# Patient Record
Sex: Female | Born: 1970 | Race: White | Hispanic: No | Marital: Married | Smoking: Never smoker
Health system: Southern US, Community
[De-identification: ages and names within clinical notes are randomized; demographics above are authoritative.]

---

## 2003-08-28 ENCOUNTER — Other Ambulatory Visit: Admission: RE | Admit: 2003-08-28 | Discharge: 2003-08-28 | Payer: Self-pay | Admitting: Family Medicine

## 2003-11-10 ENCOUNTER — Ambulatory Visit (HOSPITAL_COMMUNITY): Admission: RE | Admit: 2003-11-10 | Discharge: 2003-11-10 | Payer: Self-pay | Admitting: Obstetrics and Gynecology

## 2004-02-17 ENCOUNTER — Ambulatory Visit (HOSPITAL_COMMUNITY): Admission: RE | Admit: 2004-02-17 | Discharge: 2004-02-17 | Payer: Self-pay | Admitting: Obstetrics and Gynecology

## 2004-06-29 ENCOUNTER — Encounter (INDEPENDENT_AMBULATORY_CARE_PROVIDER_SITE_OTHER): Payer: Self-pay | Admitting: *Deleted

## 2004-06-29 ENCOUNTER — Inpatient Hospital Stay (HOSPITAL_COMMUNITY): Admission: AD | Admit: 2004-06-29 | Discharge: 2004-07-01 | Payer: Self-pay | Admitting: Obstetrics and Gynecology

## 2005-09-22 IMAGING — US US OB COMP +14 WK
1 series · 13 of 28 positions shown · non-contrast
Comparison: none

OBSTETRICAL ULTRASOUND
 Number of Fetuses:  1
 Heart Rate:  145
 Movement:  Yes
 Breathing:  No  
 Presentation:  Cephalic
 Placental Location:  Anterior
 Grade:  I
 Previa:  No
 Amniotic Fluid (Subjective):  Normal
 Amniotic Fluid (Objective):   3.9 cm Vertical pocket 

 FETAL BIOMETRY
 BPD:   4.9 cm   21 w 0 d
 HC:   18.8 cm  21 w 1 d
 AC:   14.8 cm  20 w 0 d
 FL:   3.1 cm  19 w 5 d
 MEAN GA:  20 w 3 d
 1st US GA:  19 w 6 d (Assigned)
   FETAL ANATOMY
 Lateral Ventricles:    Visualized 
 Thalami/CSP:      Visualized 
 Posterior Fossa:  Visualized 
 Nuchal Region:    Visualized 
 Spine:      Visualized 
 4 Chamber Heart on Left:      Not visualized 
 Stomach on Left:      Visualized 
 3 Vessel Cord:    Visualized 
 Cord Insertion site:    Visualized 
 Kidneys:  Visualized 
 Bladder:  Visualized 
 Extremities:      Visualized 
 ADDITIONAL ANATOMY VISUALIZED:  Upper lip, orbits, diaphragm, heel, 5th digit, ductal arch, aortic arch and male genitalia
 Evaluation limited by:  Fetal position 
 MATERNAL FINDINGS
 Cervix: 4.5 cm Transabdominally

[Series 1: unknown · 0.15mm/px · 13 of 87 slices shown]
[im 4/87]
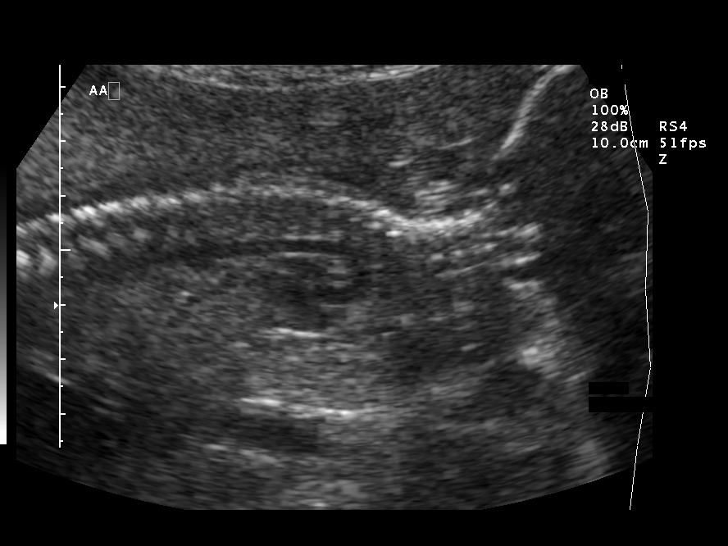
[im 10/87]
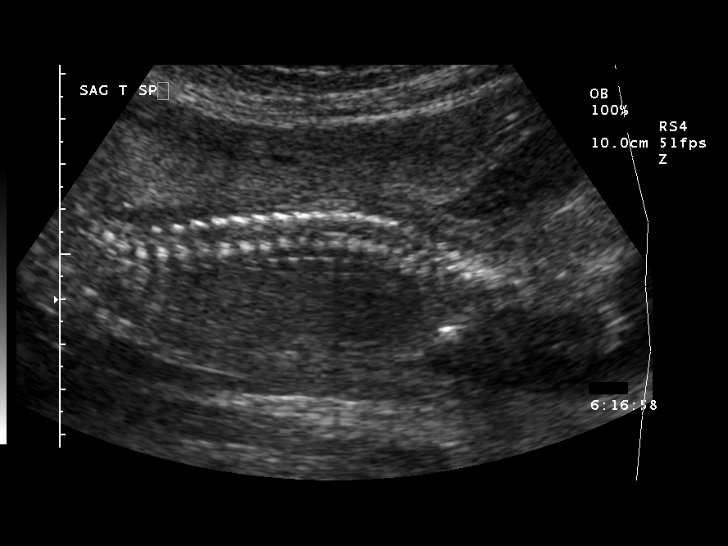
[im 16/87]
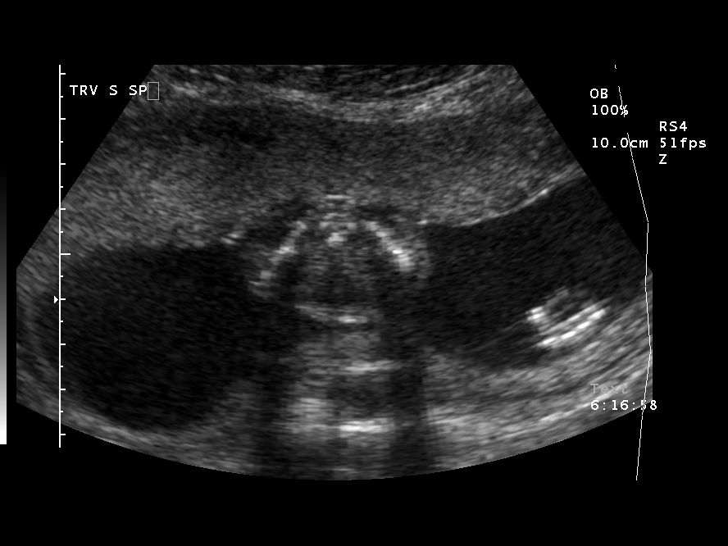
[im 23/87]
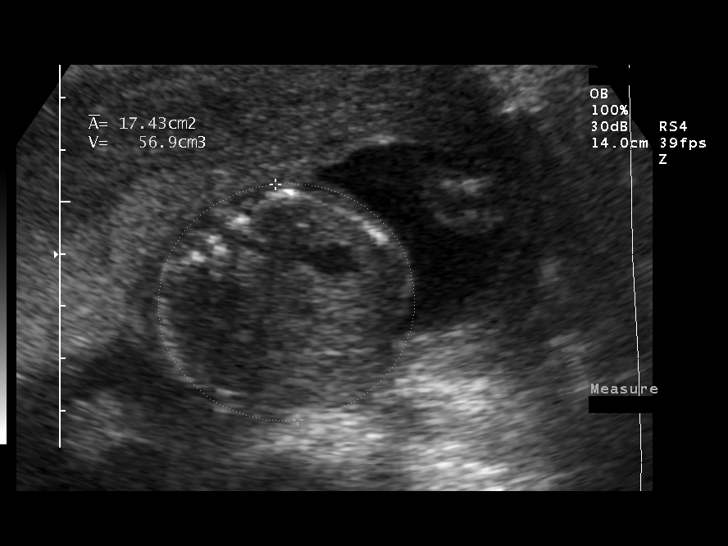
[im 29/87]
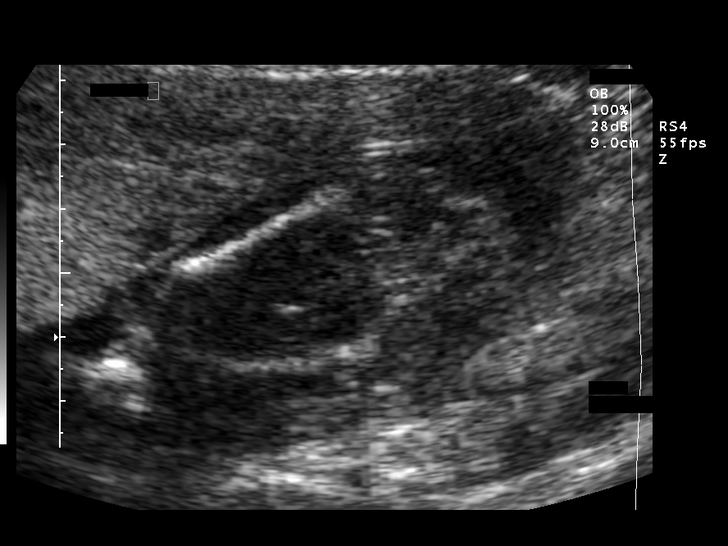
[im 36/87]
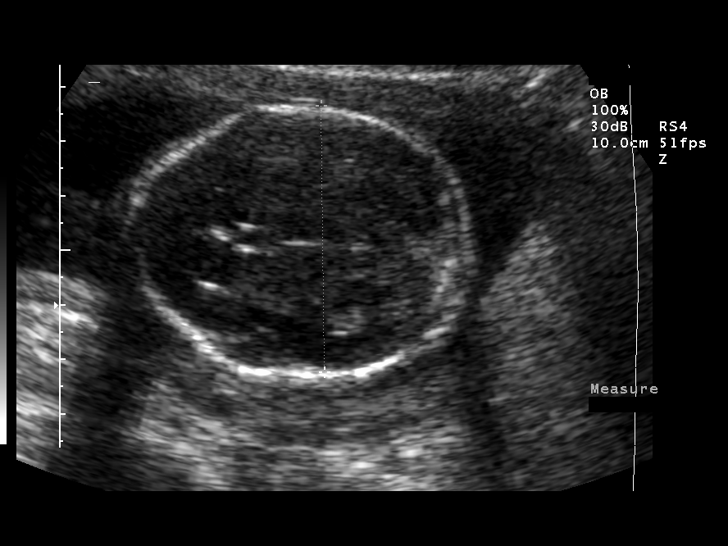
[im 45/87]
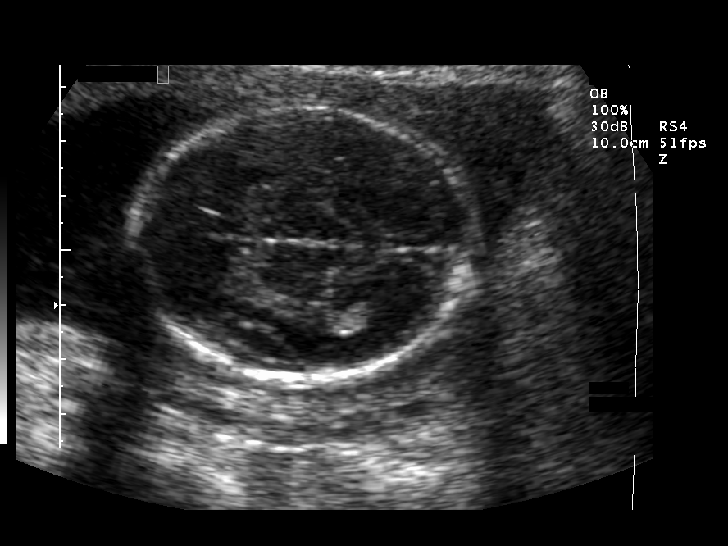
[im 51/87]
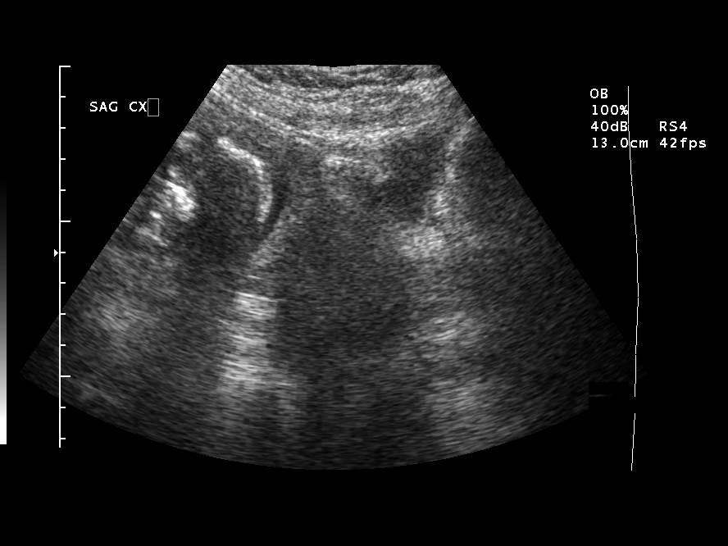
[im 58/87]
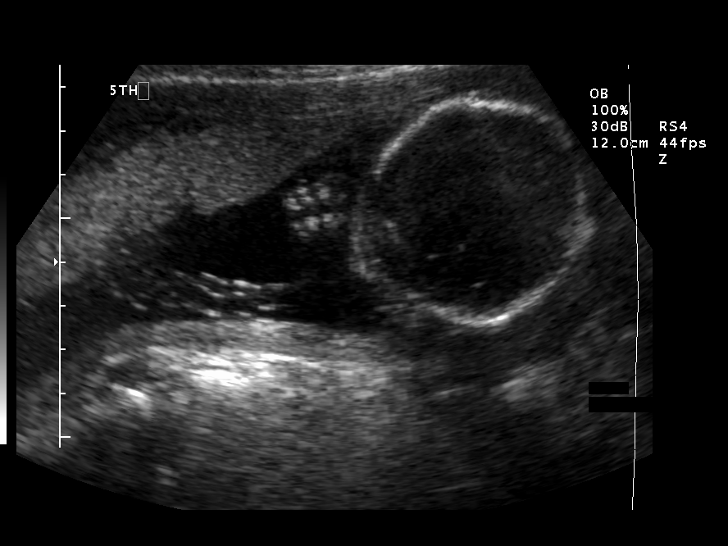
[im 64/87]
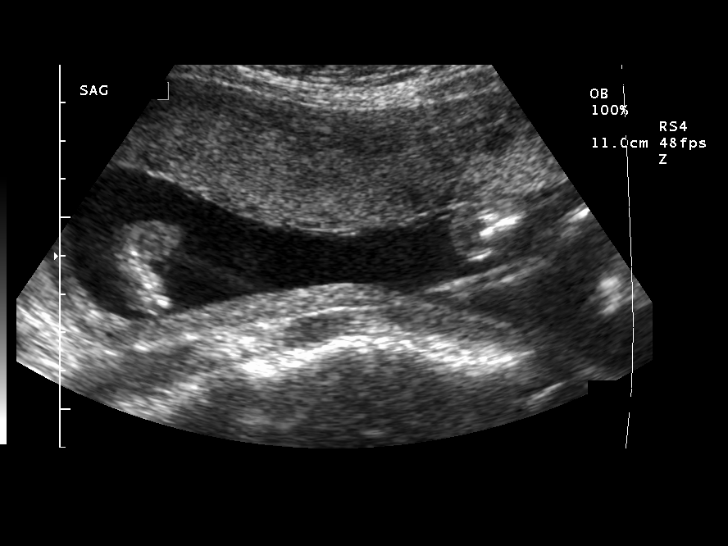
[im 71/87]
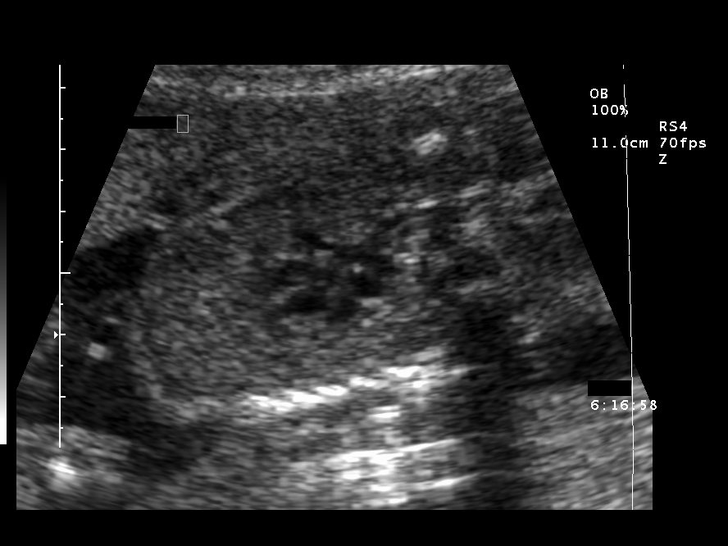
[im 77/87]
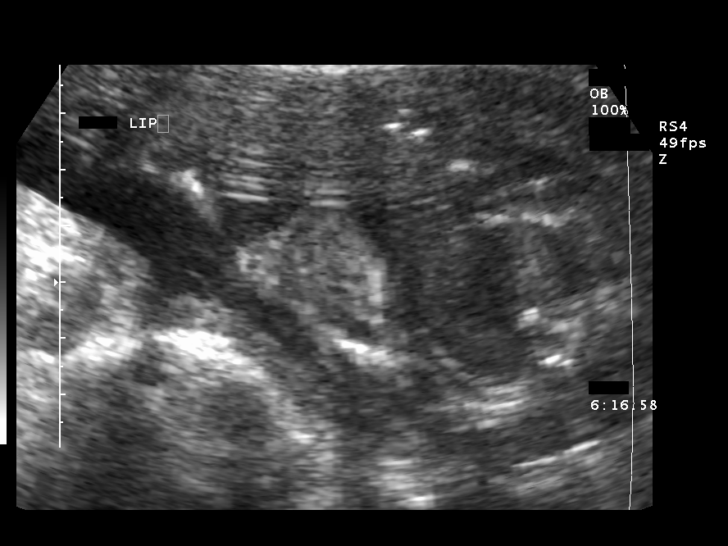
[im 83/87]
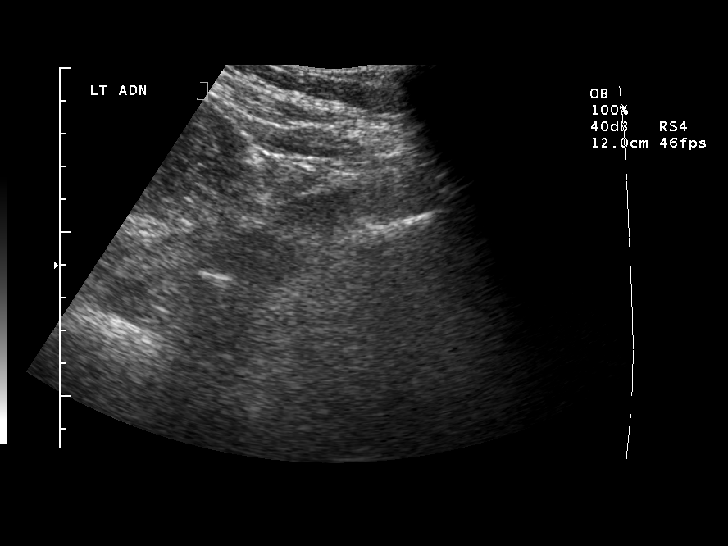

[13 of 28 positions shown; findings below may reference images not displayed]

IMPRESSION: Single living intrauterine fetus in cephalic presentation.  Patient would be 19 weeks 6 days based on office ultrasound and she measures 20 weeks 3 days today which is within the measurement variation at this gestational age.
 Cardiac anatomy and profile not seen.  Otherwise, no anatomic abnormality noted.

## 2006-07-16 ENCOUNTER — Other Ambulatory Visit: Admission: RE | Admit: 2006-07-16 | Discharge: 2006-07-16 | Payer: Self-pay | Admitting: Obstetrics and Gynecology

## 2007-07-22 ENCOUNTER — Other Ambulatory Visit: Admission: RE | Admit: 2007-07-22 | Discharge: 2007-07-22 | Payer: Self-pay | Admitting: Obstetrics and Gynecology

## 2007-11-20 ENCOUNTER — Encounter: Admission: RE | Admit: 2007-11-20 | Discharge: 2007-11-20 | Payer: Self-pay | Admitting: Obstetrics and Gynecology

## 2007-11-27 ENCOUNTER — Encounter: Admission: RE | Admit: 2007-11-27 | Discharge: 2007-11-27 | Payer: Self-pay | Admitting: Obstetrics and Gynecology

## 2008-10-28 ENCOUNTER — Other Ambulatory Visit: Admission: RE | Admit: 2008-10-28 | Discharge: 2008-10-28 | Payer: Self-pay | Admitting: Obstetrics and Gynecology

## 2009-07-05 ENCOUNTER — Emergency Department (HOSPITAL_COMMUNITY): Admission: EM | Admit: 2009-07-05 | Discharge: 2009-07-05 | Payer: Self-pay | Admitting: Emergency Medicine

## 2011-05-12 NOTE — H&P (Signed)
Leslie Andersen, Leslie Andersen                          ACCOUNT NO.:  [id number]   MEDICAL RECORD NO.:  [id number]                   PATIENT TYPE:  INP   LOCATION:  9147                                 FACILITY:  [facility]   PHYSICIAN:  James A. Ashley Royalty, M.D.             DATE OF BIRTH:  December 23, 1971   DATE OF ADMISSION:  06/29/2004  DATE OF DISCHARGE:                                HISTORY & PHYSICAL   HISTORY OF PRESENT ILLNESS:  A 40 year old gravida 2, para 1, ADC July 08, 2004, approximately 38-1/[redacted] weeks gestation.  Prenatal care was essentially  uncomplicated.  The patient presented, unannounced, to triage in planning of  labor with onset of regular contractions approximately 4:30 a.m.  She denied  any rupture of membranes at home.  She drove herself to the hospital.  Initial examination by the R.N. in the MAU unit noted the patient to be 9+  cm dilated with a bulging bag of water.  Deep variable decelerations were  noted on the monitor upon arrival to labor and delivery.   MEDICATIONS:  Vitamins.   PAST MEDICAL HISTORY:  See [place] form.   PHYSICAL EXAMINATION:  A portion of this exam was performed after delivery.  GENERAL:  A pleasant female, in no acute distress.  VITAL SIGNS:  Afebrile, vital signs stable.  SKIN:  Warm and dry without lesions.  LYMPH:  There is no supraclavicular, cervical or inguinal adenopathy.  HEENT:  Normocephalic.  NECK:  Supple without thyromegaly.  CHEST:  Lungs are clear.  CARDIAC:  Regular rate and rhythm.  ABDOMEN:  Initial exam revealed a gravid abdomen with a term fundal height.  Contractions every two minutes.  EXTREMITIES:  Negative.  VAGINAL EXAMINATION:  Initial vaginal examination per R.N. 9+ cm dilated,  vertex presentation, bulging bag of water.   DESCRIPTION OF DELIVERY:  Almost immediately after arrival to labor and  delivery, the patient experienced spontaneous rupture of membranes, which  revealed thick meconium.  At this point, peds team  was called.  The ________  was on the perineum.  The fetal heart rate was difficulty to trace, but  seemed to demonstrated continued deep variable decelerations.  The patient  delivered shortly thereafter per Dr. Rodman Pickle with Dr. Ashley Royalty present.  Delivery was a normal spontaneous vaginal.  The infant was DeLee'd on the  perineum.  Peds team was present at delivery.  There was a nuchal cord x1.  The patient delivered over an intact perineum.  There was a very-small  isolated periurethral laceration which was repaired without difficulty using  3-0 chromic catgut.  There was no anesthesia used prior to delivery.  There  was only 1% Xylocaine local used for the small repair.  Estimated blood loss  was 300 cc.  There were no complications of delivery.  The patient's group B  strep status was not noted on the chart.  Delivery occurred prior to the  possibility of any antibiotics.  The infant was a 7 pounds, 9 ounce female.  Apgars 2 at one minute, 5 at five minutes, 7 at 10 minutes.   At the time of this dictation, Dr. __________ has not decided on which  nursery will care for the baby.  Please see separate note per Dr. Rodman Pickle.   IMPRESSION:  Status post vaginal delivery with thick meconium-stained  amniotic fluid, and deep variable decelerations.   PLAN:  Routine maternal postpartum care.                                               James A. Ashley Royalty, M.D.    JAM/MEDQ  D:  06/29/2004  T:  06/29/2004  Job:  [id number]

## 2011-09-14 ENCOUNTER — Other Ambulatory Visit (HOSPITAL_COMMUNITY)
Admission: RE | Admit: 2011-09-14 | Discharge: 2011-09-14 | Disposition: A | Discharge status: Home / Self Care | Payer: Self-pay | Source: Ambulatory Visit | Attending: Obstetrics and Gynecology | Admitting: Obstetrics and Gynecology

## 2011-09-14 ENCOUNTER — Other Ambulatory Visit: Payer: Self-pay | Admitting: Obstetrics and Gynecology

## 2011-09-14 DIAGNOSIS — Z01419 Encounter for gynecological examination (general) (routine) without abnormal findings: Secondary | ICD-10-CM | POA: Insufficient documentation

## 2011-12-04 ENCOUNTER — Ambulatory Visit (INDEPENDENT_AMBULATORY_CARE_PROVIDER_SITE_OTHER): Payer: PRIVATE HEALTH INSURANCE | Admitting: General Surgery

## 2011-12-04 ENCOUNTER — Encounter (INDEPENDENT_AMBULATORY_CARE_PROVIDER_SITE_OTHER): Payer: Self-pay | Admitting: General Surgery

## 2011-12-04 VITALS — BP 138/88 | HR 64 | Temp 97.6°F | Resp 18 | Ht 64.0 in | Wt 156.1 lb

## 2011-12-04 DIAGNOSIS — K644 Residual hemorrhoidal skin tags: Secondary | ICD-10-CM | POA: Insufficient documentation

## 2011-12-04 NOTE — Patient Instructions (Signed)
Call if you want to schedule hemorrhoidectomy Call if you have an acute flare up

## 2011-12-04 NOTE — Progress Notes (Signed)
Subjective:     Patient ID: Leslie Andersen, female   DOB: [birth year], 40 y.o.   MRN: [id number]  HPI We are asked to see the patient in consultation by Dr. Dion Body  to evaluate her for hemorrhoids. The patient is a 40 year old white female who has had problems with hemorrhoids off and on since having her children. Mostly what she experiences his blood with her bowel movements. She sort of has a constantly swollen lump near her rectum. She does not have a lot of acute pain. She does have a lot of problems with diarrhea because of her irritable bowel syndrome. She denies any fevers or chills or constipation.  Review of Systems  Constitutional: Negative.   HENT: Negative.   Eyes: Negative.   Respiratory: Negative.   Cardiovascular: Negative.   Gastrointestinal: Positive for diarrhea.  Genitourinary: Negative.   Musculoskeletal: Negative.   Skin: Negative.   Neurological: Negative.   Hematological: Negative.   Psychiatric/Behavioral: Negative.        Objective:   Physical Exam  Constitutional: She is oriented to person, place, and time. She appears well-developed and well-nourished.  HENT:  Head: Normocephalic and atraumatic.  Eyes: Conjunctivae and EOM are normal. Pupils are equal, round, and reactive to light.  Neck: Normal range of motion. Neck supple.  Cardiovascular: Normal rate, regular rhythm and normal heart sounds.   Pulmonary/Chest: Effort normal and breath sounds normal.  Abdominal: Soft. Bowel sounds are normal.  Genitourinary:       She has 2 moderate-sized external hemorrhoidal skin tags. One anteriorly and the other in the left posterior position. The posterior wound looks a little more inflamed. She has only mild internal hemorrhoidal tissue today by anoscopy.  Musculoskeletal: Normal range of motion.  Neurological: She is alert and oriented to person, place, and time.  Skin: Skin is warm and dry.  Psychiatric: She has a normal mood and affect. Her behavior is normal.        Assessment:     External hemorrhoids with intermittent bleeding    Plan:     At this point I think she might benefit from a more formal hemorrhoidectomy. I have discussed her in detail the risks and benefits of the operation of dizziness as well as some technical aspects and she understands. She would like to think about it and will let us know should like to move forward.

## 2012-09-19 ENCOUNTER — Other Ambulatory Visit (HOSPITAL_COMMUNITY)
Admission: RE | Admit: 2012-09-19 | Discharge: 2012-09-19 | Disposition: A | Discharge status: Home / Self Care | Payer: 59 | Source: Ambulatory Visit | Attending: Obstetrics and Gynecology | Admitting: Obstetrics and Gynecology

## 2012-09-19 ENCOUNTER — Other Ambulatory Visit: Payer: Self-pay | Admitting: Obstetrics and Gynecology

## 2012-09-19 DIAGNOSIS — Z1151 Encounter for screening for human papillomavirus (HPV): Secondary | ICD-10-CM | POA: Insufficient documentation

## 2012-09-19 DIAGNOSIS — Z01419 Encounter for gynecological examination (general) (routine) without abnormal findings: Secondary | ICD-10-CM | POA: Insufficient documentation

## 2015-04-30 ENCOUNTER — Other Ambulatory Visit: Payer: Self-pay | Admitting: Obstetrics and Gynecology

## 2015-04-30 ENCOUNTER — Other Ambulatory Visit (HOSPITAL_COMMUNITY)
Admission: RE | Admit: 2015-04-30 | Discharge: 2015-04-30 | Disposition: A | Discharge status: Home / Self Care | Payer: 59 | Source: Ambulatory Visit | Attending: Obstetrics and Gynecology | Admitting: Obstetrics and Gynecology

## 2015-04-30 DIAGNOSIS — Z01419 Encounter for gynecological examination (general) (routine) without abnormal findings: Secondary | ICD-10-CM | POA: Diagnosis present

## 2015-04-30 DIAGNOSIS — Z1231 Encounter for screening mammogram for malignant neoplasm of breast: Secondary | ICD-10-CM

## 2015-05-03 LAB — CYTOLOGY - PAP

## 2015-07-30 ENCOUNTER — Inpatient Hospital Stay: Admission: RE | Admit: 2015-07-30 | Payer: 59 | Source: Ambulatory Visit

## 2016-06-06 ENCOUNTER — Other Ambulatory Visit (HOSPITAL_COMMUNITY)
Admission: RE | Admit: 2016-06-06 | Discharge: 2016-06-06 | Disposition: A | Discharge status: Home / Self Care | Payer: BLUE CROSS/BLUE SHIELD | Source: Ambulatory Visit | Attending: Obstetrics and Gynecology | Admitting: Obstetrics and Gynecology

## 2016-06-06 ENCOUNTER — Other Ambulatory Visit: Payer: Self-pay | Admitting: Obstetrics and Gynecology

## 2016-06-06 DIAGNOSIS — Z01419 Encounter for gynecological examination (general) (routine) without abnormal findings: Secondary | ICD-10-CM | POA: Insufficient documentation

## 2016-06-06 DIAGNOSIS — Z1151 Encounter for screening for human papillomavirus (HPV): Secondary | ICD-10-CM | POA: Diagnosis not present

## 2016-06-07 LAB — CYTOLOGY - PAP

## 2019-11-05 ENCOUNTER — Other Ambulatory Visit: Payer: Self-pay | Admitting: Obstetrics and Gynecology

## 2019-11-05 ENCOUNTER — Other Ambulatory Visit (HOSPITAL_COMMUNITY)
Admission: RE | Admit: 2019-11-05 | Discharge: 2019-11-05 | Disposition: A | Discharge status: Home / Self Care | Payer: 59 | Source: Ambulatory Visit | Attending: Obstetrics and Gynecology | Admitting: Obstetrics and Gynecology

## 2019-11-05 DIAGNOSIS — Z124 Encounter for screening for malignant neoplasm of cervix: Secondary | ICD-10-CM | POA: Insufficient documentation

## 2019-11-11 LAB — CYTOLOGY - PAP
Chlamydia: NEGATIVE
Comment: NEGATIVE
Comment: NEGATIVE
Comment: NORMAL
Diagnosis: NEGATIVE
High risk HPV: NEGATIVE
Neisseria Gonorrhea: NEGATIVE

## 2020-04-01 ENCOUNTER — Ambulatory Visit: Payer: 59 | Attending: Internal Medicine

## 2020-04-01 DIAGNOSIS — Z23 Encounter for immunization: Secondary | ICD-10-CM

## 2020-04-01 NOTE — Progress Notes (Signed)
   Covid-19 Vaccination Clinic  Name:  Leslie Andersen    MRN: [id number] DOB: 1971-03-13  04/01/2020  [name] was observed post Covid-19 immunization for 15 minutes without incident. She was provided with Vaccine Information Sheet and instruction to access the V-Safe system.   [name] was instructed to call 911 with any severe reactions post vaccine: Marland Kitchen Difficulty breathing  . Swelling of face and throat  . A fast heartbeat  . A bad rash all over body  . Dizziness and weakness   Immunizations Administered    Name Date Dose VIS Date Route   Pfizer COVID-19 Vaccine 04/01/2020 12:11 PM 0.3 mL 12/05/2019 Intramuscular   Manufacturer: [employer], [employer]   Lot: [id number]   NDC: [address]-3

## 2020-04-26 ENCOUNTER — Ambulatory Visit: Payer: 59 | Attending: Internal Medicine

## 2020-04-26 DIAGNOSIS — Z23 Encounter for immunization: Secondary | ICD-10-CM

## 2020-04-26 NOTE — Progress Notes (Signed)
   Covid-19 Vaccination Clinic  Name:  Markita Stcharles    MRN: [id number] DOB: 1971/05/14  04/26/2020  Ms. Parrow was observed post Covid-19 immunization for 15 minutes without incident. She was provided with Vaccine Information Sheet and instruction to access the V-Safe system.   Ms. Kraker was instructed to call 911 with any severe reactions post vaccine: Marland Kitchen Difficulty breathing  . Swelling of face and throat  . A fast heartbeat  . A bad rash all over body  . Dizziness and weakness   Immunizations Administered    Name Date Dose VIS Date Route   Pfizer COVID-19 Vaccine 04/26/2020 10:57 AM 0.3 mL 02/18/2019 Intramuscular   Manufacturer: [employer], [employer]   Lot: [id number]   NDC: [address]-7

## 2020-12-31 ENCOUNTER — Other Ambulatory Visit: Payer: Self-pay | Admitting: Physician Assistant

## 2020-12-31 DIAGNOSIS — R1011 Right upper quadrant pain: Secondary | ICD-10-CM

## 2021-01-14 ENCOUNTER — Other Ambulatory Visit: Payer: Self-pay | Admitting: Physician Assistant

## 2021-01-14 DIAGNOSIS — R1011 Right upper quadrant pain: Secondary | ICD-10-CM

## 2021-01-18 ENCOUNTER — Other Ambulatory Visit: Payer: 59
